# Patient Record
Sex: Male | Born: 1945 | Race: White | Hispanic: No | Smoking: Current every day smoker
Health system: Southern US, Community
[De-identification: ages and names within clinical notes are randomized; demographics above are authoritative.]

## PROBLEM LIST (undated history)

## (undated) DIAGNOSIS — I2781 Cor pulmonale (chronic): Secondary | ICD-10-CM

## (undated) DIAGNOSIS — J439 Emphysema, unspecified: Secondary | ICD-10-CM

## (undated) DIAGNOSIS — I10 Essential (primary) hypertension: Secondary | ICD-10-CM

## (undated) DIAGNOSIS — K219 Gastro-esophageal reflux disease without esophagitis: Secondary | ICD-10-CM

## (undated) DIAGNOSIS — F1021 Alcohol dependence, in remission: Secondary | ICD-10-CM

## (undated) DIAGNOSIS — N400 Enlarged prostate without lower urinary tract symptoms: Secondary | ICD-10-CM

## (undated) DIAGNOSIS — Z9989 Dependence on other enabling machines and devices: Secondary | ICD-10-CM

## (undated) HISTORY — DX: Benign prostatic hyperplasia without lower urinary tract symptoms: N40.0

## (undated) HISTORY — DX: Cor pulmonale (chronic): I27.81

## (undated) HISTORY — DX: Gastro-esophageal reflux disease without esophagitis: K21.9

## (undated) HISTORY — DX: Essential (primary) hypertension: I10

## (undated) HISTORY — DX: Emphysema, unspecified: J43.9

## (undated) HISTORY — DX: Dependence on other enabling machines and devices: Z99.89

## (undated) HISTORY — DX: Alcohol dependence, in remission: F10.21

## (undated) HISTORY — PX: NO PAST SURGERIES: SHX2092

---

## 2013-11-19 ENCOUNTER — Other Ambulatory Visit: Payer: Self-pay | Admitting: Family Medicine

## 2013-11-19 DIAGNOSIS — M25511 Pain in right shoulder: Secondary | ICD-10-CM

## 2013-11-27 ENCOUNTER — Other Ambulatory Visit: Payer: Self-pay

## 2013-12-21 ENCOUNTER — Other Ambulatory Visit: Payer: Self-pay | Admitting: Family Medicine

## 2013-12-21 DIAGNOSIS — R109 Unspecified abdominal pain: Secondary | ICD-10-CM

## 2013-12-27 ENCOUNTER — Ambulatory Visit
Admission: RE | Admit: 2013-12-27 | Discharge: 2013-12-27 | Disposition: A | Discharge status: Home / Self Care | Payer: No Typology Code available for payment source | Source: Ambulatory Visit | Attending: Family Medicine | Admitting: Family Medicine

## 2013-12-27 ENCOUNTER — Other Ambulatory Visit: Payer: Self-pay

## 2013-12-27 DIAGNOSIS — R109 Unspecified abdominal pain: Secondary | ICD-10-CM

## 2014-01-10 ENCOUNTER — Encounter: Payer: Self-pay | Admitting: Gastroenterology

## 2014-01-16 ENCOUNTER — Other Ambulatory Visit: Payer: Self-pay | Admitting: *Deleted

## 2014-01-16 ENCOUNTER — Ambulatory Visit
Admission: RE | Admit: 2014-01-16 | Discharge: 2014-01-16 | Disposition: A | Discharge status: Home / Self Care | Payer: No Typology Code available for payment source | Source: Ambulatory Visit | Attending: *Deleted | Admitting: *Deleted

## 2014-01-16 DIAGNOSIS — R0602 Shortness of breath: Secondary | ICD-10-CM

## 2014-02-12 ENCOUNTER — Ambulatory Visit: Payer: Self-pay | Admitting: Gastroenterology

## 2014-04-19 ENCOUNTER — Ambulatory Visit (INDEPENDENT_AMBULATORY_CARE_PROVIDER_SITE_OTHER): Payer: Medicare (Managed Care) | Admitting: Gastroenterology

## 2014-04-19 ENCOUNTER — Other Ambulatory Visit (INDEPENDENT_AMBULATORY_CARE_PROVIDER_SITE_OTHER): Payer: Medicare (Managed Care)

## 2014-04-19 ENCOUNTER — Encounter: Payer: Self-pay | Admitting: Gastroenterology

## 2014-04-19 VITALS — BP 128/80 | HR 76 | Ht 68.0 in | Wt 119.4 lb

## 2014-04-19 DIAGNOSIS — R1084 Generalized abdominal pain: Secondary | ICD-10-CM

## 2014-04-19 DIAGNOSIS — R634 Abnormal weight loss: Secondary | ICD-10-CM

## 2014-04-19 NOTE — Progress Notes (Signed)
HPI: This is a   very pleasant, chronically ill man whom I am meeting for the first time  Trouble with constipation.  Takes miralax periodically.  Not sure that is helps much at all.  Has not tried fiber supplement.  Cramping a lot.  BMS sometimes help his cramping.  Losing weight, about 20 pounds in past 6-9 months.  Eats well, at least once per day.  No nausea, no vomiting.  He had colonoscopy in [place] (believes it was normal).  Never had EGD.  No colon cancer in his family.  Mother had breast cancer.  Alcoholic, sober for 10 years.  Smokes about a half pack a day. This is much better than his to 3 packs per day previously. He is on chronic oxygen.  Takes alleve 2 times per month.  US recently but no CT scan.  Review of systems: Pertinent positive and negative review of systems were noted in the above HPI section. Complete review of systems was performed and was otherwise normal.    Past Medical History  Diagnosis Date  . Emphysema/COPD   . HTN (hypertension)   . GERD (gastroesophageal reflux disease)   . Cor pulmonale   . CPAP (continuous positive airway pressure) dependence   . History of alcoholism   . BPH (benign prostatic hyperplasia)     Past Surgical History  Procedure Laterality Date  . No past surgeries      Current Outpatient Prescriptions  Medication Sig Dispense Refill  . albuterol (ACCUNEB) 1.25 MG/3ML nebulizer solution Take 1 ampule by nebulization every 6 (six) hours as needed for wheezing.    Marland Kitchen. albuterol (PROVENTIL HFA;VENTOLIN HFA) 108 (90 BASE) MCG/ACT inhaler Inhale 2 puffs into the lungs every 6 (six) hours as needed for wheezing or shortness of breath.    Marland Kitchen. aspirin 81 MG tablet Take 81 mg by mouth daily.    . budesonide-formoterol (SYMBICORT) 160-4.5 MCG/ACT inhaler Inhale 2 puffs into the lungs 2 (two) times daily.    . feeding supplement (BOOST HIGH PROTEIN) LIQD Take 1 Container by mouth 3 (three) times daily between meals.    .  naproxen sodium (ANAPROX) 220 MG tablet Take 220 mg by mouth 2 (two) times daily with a meal.    . OXYGEN Inhale into the lungs.    . pantoprazole (PROTONIX) 40 MG tablet Take 40 mg by mouth daily.    . tamsulosin (FLOMAX) 0.4 MG CAPS capsule Take 0.4 mg by mouth.    . tiotropium (SPIRIVA) 18 MCG inhalation capsule Place 18 mcg into inhaler and inhale daily.     No current facility-administered medications for this visit.    Allergies as of 04/19/2014  . (Not on File)    Family History  Problem Relation Age of Onset  . Breast cancer Mother     History   Social History  . Marital Status: Unknown    Spouse Name: N/A    Number of Children: 3  . Years of Education: GED   Occupational History  . Retired    Social History Main Topics  . Smoking status: Current Every Day Smoker -- 0.50 packs/day for 59 years    Types: Cigarettes  . Smokeless tobacco: Not on file  . Alcohol Use: No  . Drug Use: Not on file  . Sexual Activity: Not on file   Other Topics Concern  . Not on file   Social History Narrative       Physical Exam: BP 128/80 mmHg  Pulse  76  Ht 5\' 8"  (1.727 m)  Wt 119 lb 6 oz (54.148 kg)  BMI 18.16 kg/m2 Constitutional: generally well-appearing Psychiatric: alert and oriented x3 Eyes: extraocular movements intact Mouth: oral pharynx moist, no lesions Neck: supple no lymphadenopathy Cardiovascular: heart regular rate and rhythm Lungs: clear to auscultation bilaterally Abdomen: soft, nontender, nondistended, no obvious ascites, no peritoneal signs, normal bowel sounds Extremities: no lower extremity edema bilaterally Skin: no lesions on visible extremities    Assessment and plan: 68 y.o. male with  abdominal pain, constipation, weight loss  He tells me he had a colonoscopy in [place] last year and we will track down those records. Recommended he start fiber supplements for his chronic constipation. He needs a basic set of labs including CBC and  complete metabolic profile. I'm concerned about his abdominal pain is weight loss, perhaps some underlying latency. He is going to have a CT scan with IV and oral contrast to work that up. He is clearly very frail on chronic oxygen, walks with a walker.

## 2014-04-19 NOTE — Patient Instructions (Addendum)
You will be set up for a CT scan of abdomen and pelvis with IV and oral contrast. You will have labs checked today in the basement lab.  Please head down after you check out with the front desk  (cbc, cmet). We will get records sent from your previous gastroenterologist at [facility] (colonoscopy last year) for review.  This will include any endoscopic (colonoscopy or upper endoscopy) procedures and any associated pathology reports.   Please start taking citrucel (orange flavored) powder fiber supplement.  This may cause some bloating at first but that usually goes away. Begin with a small spoonful and work your way up to a large, heaping spoonful daily over a week.  You have been scheduled for a CT scan of the abdomen and pelvis at [facility] ([address]---this is in the same building as [facility]).         You are scheduled on 12/9 at 11:30am. You should arrive 15 minutes prior to your appointment time for registration. Please follow the written instructions below on the day of your exam:  WARNING: IF YOU ARE ALLERGIC TO IODINE/X-RAY DYE, PLEASE NOTIFY RADIOLOGY IMMEDIATELY AT [phone]! YOU WILL BE GIVEN A 13 HOUR PREMEDICATION PREP.  1) Do not eat or drink anything after 7:30am (4 hours prior to your test) 2) You have been given 2 bottles of oral contrast to drink. The solution may taste               better if refrigerated, but do NOT add ice or any other liquid to this solution. Shake             well before drinking.    Drink 1 bottle of contrast @ 9:30am (2 hours prior to your exam)  Drink 1 bottle of contrast @ 10:30am (1 hour prior to your exam)  You may take any medications as prescribed with a small amount of water except for the following: Metformin, Glucophage, Glucovance, Avandamet, Riomet, Fortamet, Actoplus Met, Janumet, Glumetza or Metaglip. The above medications must be held the day of the exam AND 48 hours after the  exam.  The purpose of you drinking the oral contrast is to aid in the visualization of your intestinal tract. The contrast solution may cause some diarrhea. Before your exam is started, you will be given a small amount of fluid to drink. Depending on your individual set of symptoms, you may also receive an intravenous injection of x-ray contrast/dye. Plan on being at [facility] for 30 minutes or long, depending on the type of exam you are having performed.  This test typically takes 30-45 minutes to complete.  If you have any questions regarding your exam or if you need to reschedule, you may call the CT department at [phone] between the hours of 8:00 am and 5:00 pm, Monday-Friday.  ________________________________________________________________________

## 2014-04-20 LAB — CBC WITH DIFFERENTIAL/PLATELET
BASOS ABS: 0 10*3/uL (ref 0.0–0.1)
Basophils Relative: 0.1 % (ref 0.0–3.0)
Eosinophils Absolute: 0 10*3/uL (ref 0.0–0.7)
Eosinophils Relative: 0.1 % (ref 0.0–5.0)
HEMATOCRIT: 41.2 % (ref 39.0–52.0)
Hemoglobin: 13.5 g/dL (ref 13.0–17.0)
LYMPHS ABS: 0.5 10*3/uL — AB (ref 0.7–4.0)
Lymphocytes Relative: 4.4 % — ABNORMAL LOW (ref 12.0–46.0)
MCHC: 32.7 g/dL (ref 30.0–36.0)
MCV: 87.6 fl (ref 78.0–100.0)
Monocytes Absolute: 0.2 10*3/uL (ref 0.1–1.0)
Monocytes Relative: 1.8 % — ABNORMAL LOW (ref 3.0–12.0)
Neutro Abs: 11.4 10*3/uL — ABNORMAL HIGH (ref 1.4–7.7)
Neutrophils Relative %: 93.6 % — ABNORMAL HIGH (ref 43.0–77.0)
PLATELETS: 268 10*3/uL (ref 150.0–400.0)
RBC: 4.71 Mil/uL (ref 4.22–5.81)
RDW: 16.3 % — ABNORMAL HIGH (ref 11.5–15.5)
WBC: 12.2 10*3/uL — ABNORMAL HIGH (ref 4.0–10.5)

## 2014-04-21 LAB — COMPREHENSIVE METABOLIC PANEL
ALK PHOS: 53 U/L (ref 39–117)
ALT: 14 U/L (ref 0–53)
AST: 21 U/L (ref 0–37)
Albumin: 4.4 g/dL (ref 3.5–5.2)
BILIRUBIN TOTAL: 0.6 mg/dL (ref 0.2–1.2)
BUN: 11 mg/dL (ref 6–23)
CO2: 34 meq/L — AB (ref 19–32)
Calcium: 9.9 mg/dL (ref 8.4–10.5)
Chloride: 97 mEq/L (ref 96–112)
Creatinine, Ser: 0.7 mg/dL (ref 0.4–1.5)
GFR: 123.27 mL/min (ref >60.00)
Glucose, Bld: 100 mg/dL — ABNORMAL HIGH (ref 70–99)
Potassium: 3.9 mEq/L (ref 3.5–5.1)
Sodium: 141 mEq/L (ref 135–145)
TOTAL PROTEIN: 7 g/dL (ref 6.0–8.3)

## 2014-04-23 ENCOUNTER — Other Ambulatory Visit: Payer: Self-pay | Admitting: *Deleted

## 2014-04-23 ENCOUNTER — Ambulatory Visit
Admission: RE | Admit: 2014-04-23 | Discharge: 2014-04-23 | Disposition: A | Discharge status: Home / Self Care | Payer: No Typology Code available for payment source | Source: Ambulatory Visit | Attending: *Deleted | Admitting: *Deleted

## 2014-04-23 ENCOUNTER — Other Ambulatory Visit: Payer: Self-pay

## 2014-04-23 DIAGNOSIS — J449 Chronic obstructive pulmonary disease, unspecified: Secondary | ICD-10-CM

## 2014-04-23 DIAGNOSIS — R079 Chest pain, unspecified: Secondary | ICD-10-CM

## 2014-04-23 DIAGNOSIS — R935 Abnormal findings on diagnostic imaging of other abdominal regions, including retroperitoneum: Secondary | ICD-10-CM

## 2014-04-23 DIAGNOSIS — D72829 Elevated white blood cell count, unspecified: Secondary | ICD-10-CM

## 2014-04-24 ENCOUNTER — Ambulatory Visit (INDEPENDENT_AMBULATORY_CARE_PROVIDER_SITE_OTHER)
Admission: RE | Admit: 2014-04-24 | Discharge: 2014-04-24 | Disposition: A | Discharge status: Home / Self Care | Payer: Self-pay | Source: Ambulatory Visit | Attending: Gastroenterology | Admitting: Gastroenterology

## 2014-04-24 DIAGNOSIS — R1084 Generalized abdominal pain: Secondary | ICD-10-CM

## 2014-04-24 MED ORDER — IOHEXOL 300 MG/ML  SOLN: 100.0000 mL | Freq: Once | INTRAMUSCULAR | Status: AC | PRN

## 2014-06-26 ENCOUNTER — Ambulatory Visit: Payer: Self-pay | Admitting: Gastroenterology

## 2014-09-03 ENCOUNTER — Ambulatory Visit: Payer: Self-pay | Admitting: Gastroenterology

## 2014-09-03 ENCOUNTER — Ambulatory Visit (INDEPENDENT_AMBULATORY_CARE_PROVIDER_SITE_OTHER): Payer: Medicare (Managed Care) | Admitting: Gastroenterology

## 2014-09-03 ENCOUNTER — Encounter: Payer: Self-pay | Admitting: Gastroenterology

## 2014-09-03 VITALS — BP 128/64 | HR 84 | Ht 68.0 in | Wt 118.2 lb

## 2014-09-03 DIAGNOSIS — K5909 Other constipation: Secondary | ICD-10-CM | POA: Diagnosis not present

## 2014-09-03 NOTE — Patient Instructions (Signed)
Increase to 2 doses of miralax, once daily. Please start taking citrucel (orange flavored) powder fiber supplement.  This may cause some bloating at first but that usually goes away. Begin with a small spoonful and work your way up to a large, heaping spoonful daily over a week. Call Dr. Christella HartiganJacobs' office in 6 weeks, if no significant improvement in your constipation, will consider linzess trial.

## 2014-09-03 NOTE — Progress Notes (Signed)
Review of pertinent gastrointestinal problems: 1. abd pain, constipation, weight loss: Evaluated by Dr. Christella Hartigan December 2015, very frail on oxygen and walking with a walke, or gets around in an automatic wheelchair.  Labs showed slightly elevated white count with a left shift. I suggested urinalysis however he never had that done. CXR showed emphysema.  CT scan abd/pelvis with IV and oral contrast suggested dilated left urinary collecting system and I recommended referral to urologist.  HPI: This is a  very pleasant 69 year old man whom I last saw 4 months ago  Chief complaint is continued constipation  He had a rough weekend with cramping abd pains, couldn't catch his breath.  A lot of cramping.  He will have a bowel movements every 3-5 days, sometimes daily. Always has to strain quite a bit to move his bowels. Never sees blood.  Has been taking MiraLAX one dose once daily for the past 3 or 4 months and really has not noticed any improvement in his bowels   Past Medical History  Diagnosis Date  . Emphysema/COPD   . HTN (hypertension)   . GERD (gastroesophageal reflux disease)   . Cor pulmonale   . CPAP (continuous positive airway pressure) dependence   . History of alcoholism   . BPH (benign prostatic hyperplasia)     Past Surgical History  Procedure Laterality Date  . No past surgeries      Current Outpatient Prescriptions  Medication Sig Dispense Refill  . albuterol (ACCUNEB) 1.25 MG/3ML nebulizer solution Take 1 ampule by nebulization every 6 (six) hours as needed for wheezing.    Marland Kitchen albuterol (PROVENTIL HFA;VENTOLIN HFA) 108 (90 BASE) MCG/ACT inhaler Inhale 2 puffs into the lungs every 6 (six) hours as needed for wheezing or shortness of breath.    Marland Kitchen aspirin 81 MG tablet Take 81 mg by mouth daily.    . feeding supplement (BOOST HIGH PROTEIN) LIQD Take 1 Container by mouth 3 (three) times daily between meals.    . OXYGEN Inhale 2 Units into the lungs.     . pantoprazole  (PROTONIX) 40 MG tablet Take 40 mg by mouth daily.     No current facility-administered medications for this visit.    Allergies as of 09/03/2014  . (No Known Allergies)    Family History  Problem Relation Age of Onset  . Breast cancer Mother     History   Social History  . Marital Status: Unknown    Spouse Name: N/A  . Number of Children: 3  . Years of Education: GED   Occupational History  . Retired    Social History Main Topics  . Smoking status: Current Every Day Smoker -- 0.50 packs/day for 59 years    Types: Cigarettes  . Smokeless tobacco: Not on file  . Alcohol Use: No  . Drug Use: Not on file  . Sexual Activity: Not on file   Other Topics Concern  . Not on file   Social History Narrative     Physical Exam: BP 128/64 mmHg  Pulse 84  Ht  (1.727 m)  Wt 118 lb 4 oz (53.638 kg)  BMI 17.98 kg/m2 Constitutional: Chronically ill-appearing on oxygen, sitting in a automatic electric wheelchair Psychiatric: alert and oriented x3 Abdomen: soft, nontender, nondistended, no obvious ascites, no peritoneal signs, normal bowel sounds   Assessment and plan: 69 y.o. male with end-stage COPD, emphysema, chronic constipation  I have quite a lot of confidence that I'll be able to get his bowels moving  easier by titrating some of his medicines. He is going to increase his MiraLAX dose to 2 doses once daily. He will also add daily powder fiber supplement. He will call to report on his response in 6 weeks and if he is still significantly troubled with constipation then I will likely add 1 low strength Linzess pill once daily. He has severe COPD, basically unable to walk on oxygen therapy and he is not a candidate for any endoscopic therapy unless an emergency arises   Rob Buntinganiel Baron Parmelee, MD [facility] 09/03/2014, 10:04 AM

## 2015-09-22 IMAGING — CR DG CHEST 2V
2 series · 2 of 2 positions shown · non-contrast
Comparison: CT chest 06/07/2013 and chest radiograph 02/21/2013.

CLINICAL DATA: Shortness of breath, on oxygen.

EXAM:
CHEST  2 VIEW

[w chest lat]
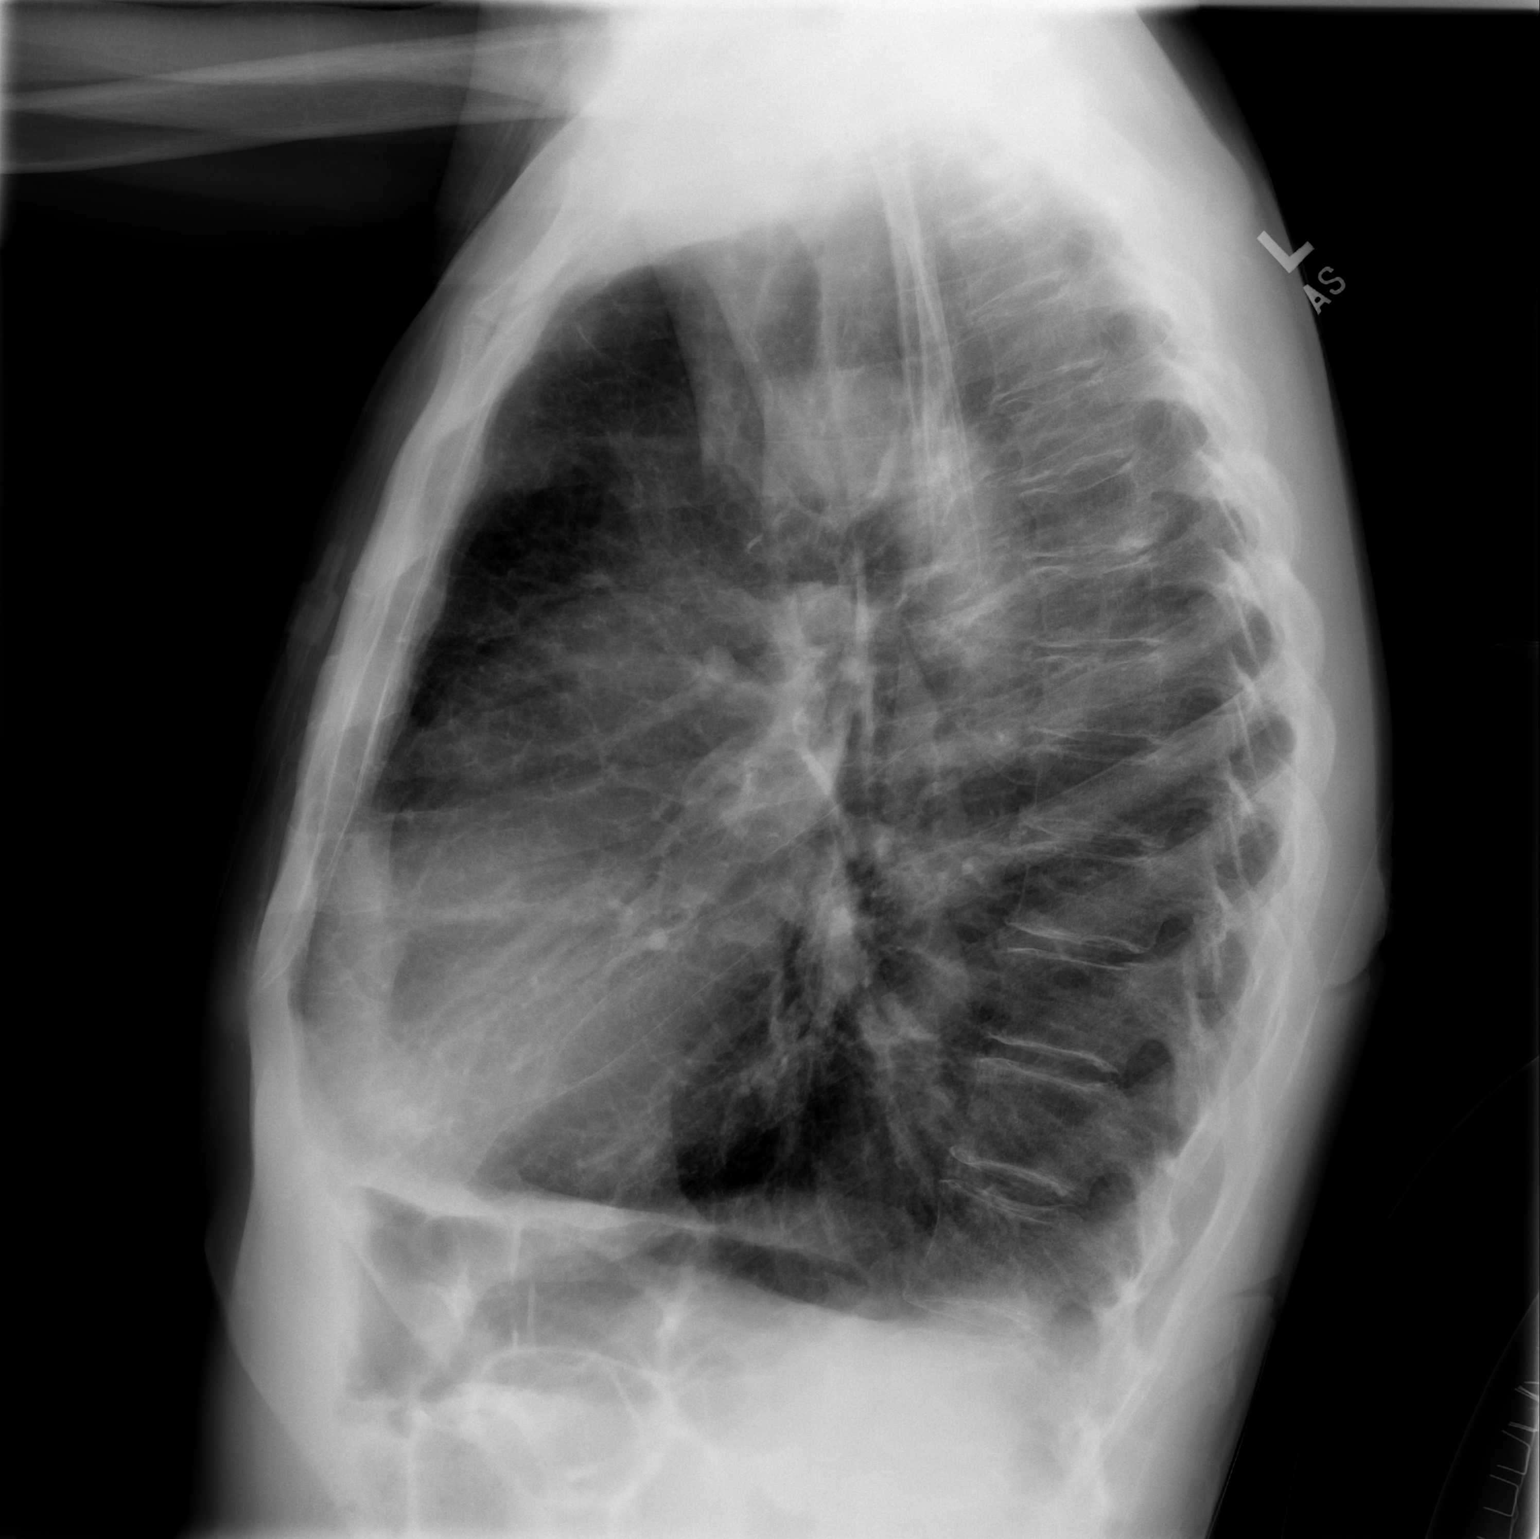

[w chest ap]
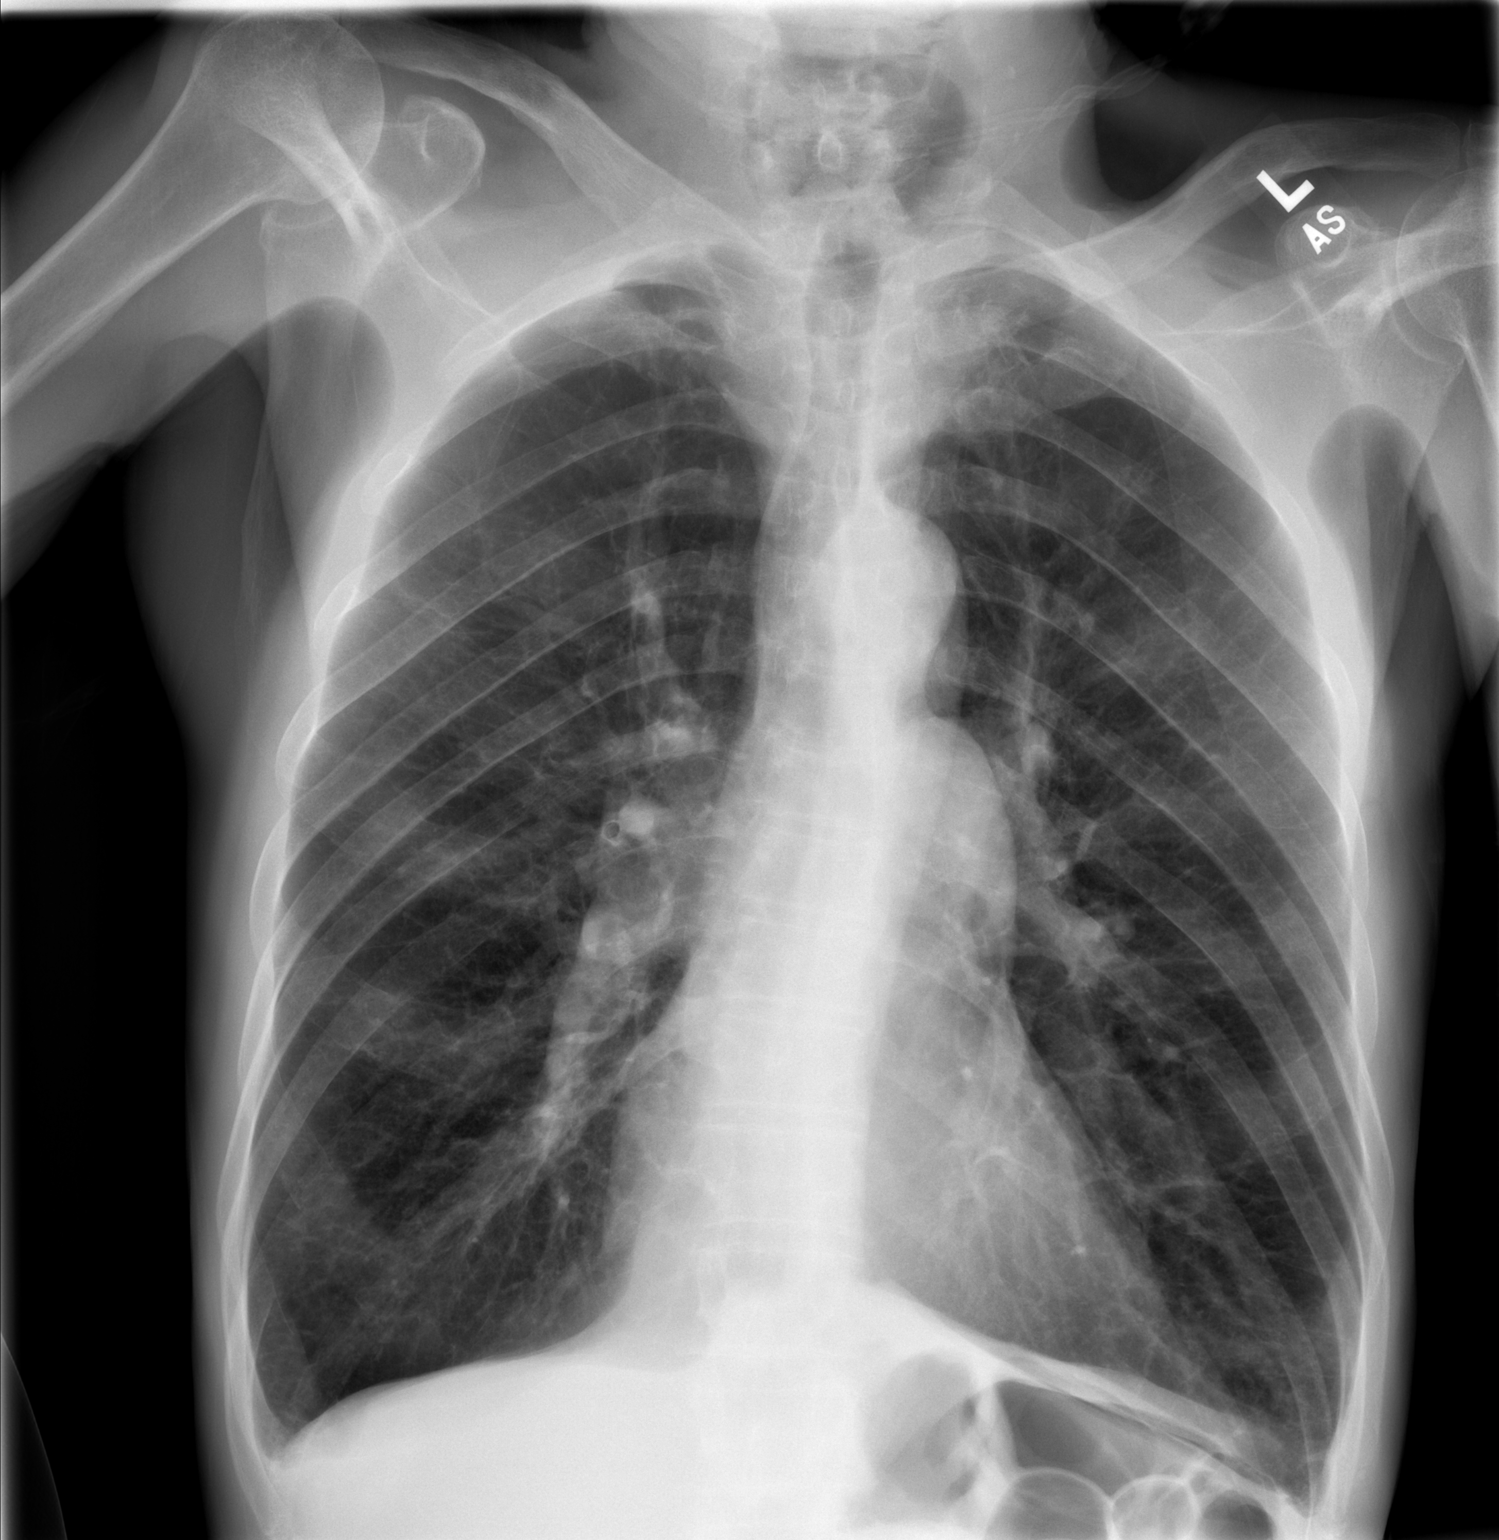

[2 of 2 positions shown; findings below may reference images not displayed]

FINDINGS: Trachea is midline. Heart size normal. Lungs are emphysematous but
clear. No pleural fluid.
IMPRESSION: Emphysema without acute finding.

## 2015-12-29 IMAGING — CT CT ABD-PELV W/ CM
2 of 5 series · 16 of 46 positions shown, 18 images · IV contrast (Omnipaque 300)
Comparison: 12/27/2013; 06/07/2013

CLINICAL DATA: Mid abdominal pain.  Constipation

EXAM:
CT ABDOMEN AND PELVIS WITH CONTRAST
TECHNIQUE: Multidetector CT imaging of the abdomen and pelvis was performed
using the standard protocol following bolus administration of
intravenous contrast.
CONTRAST:  100 cc Omnipaque 300

[Series 2: abd/ pel 5mm · axial · 0.70mm/px · z∈[-556,-200]mm · 13 of 81 slices shown, 15 images]
[im 5/81  soft-tissue]
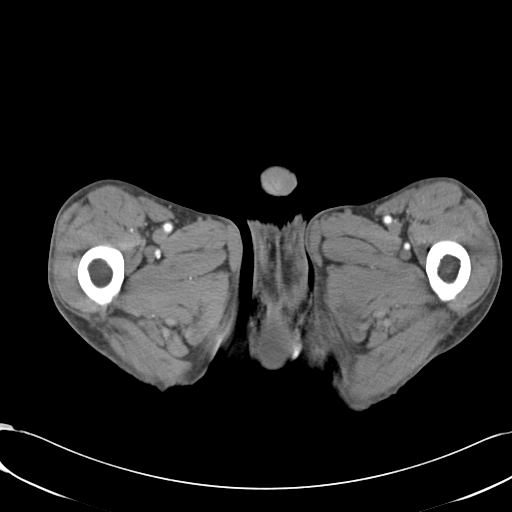
[im 5/81  bone]
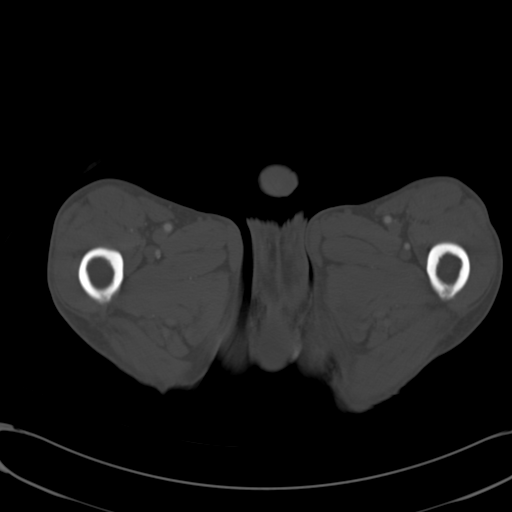
[im 10/81  soft-tissue]
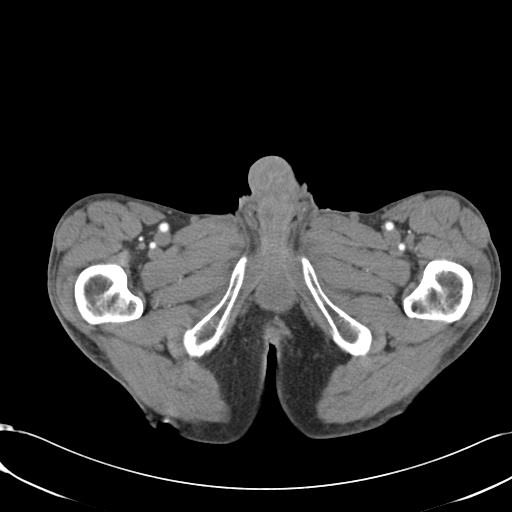
[im 19/81  soft-tissue]
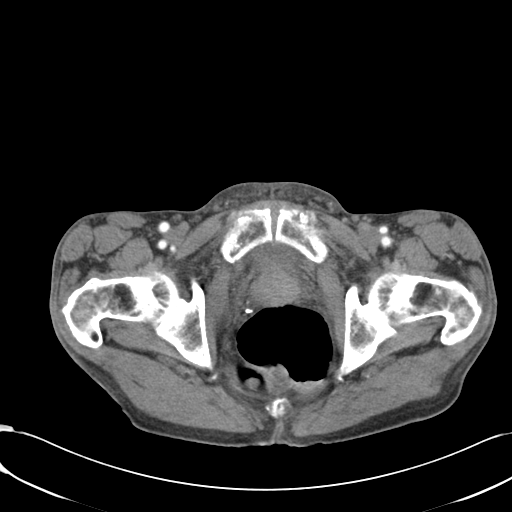
[im 24/81  soft-tissue]
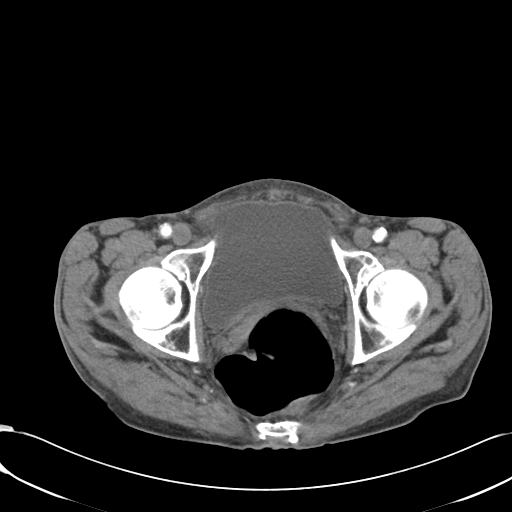
[im 29/81  soft-tissue]
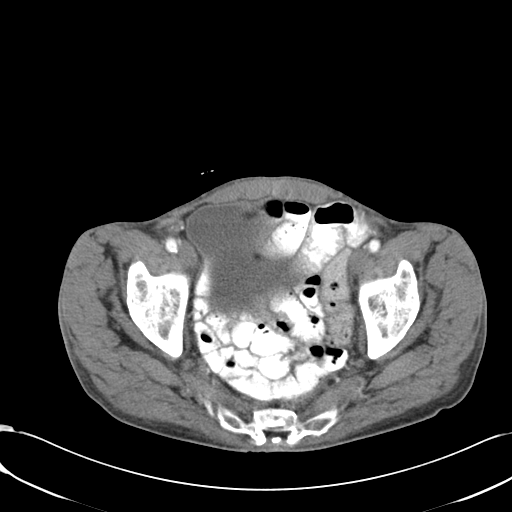
[im 33/81  soft-tissue]
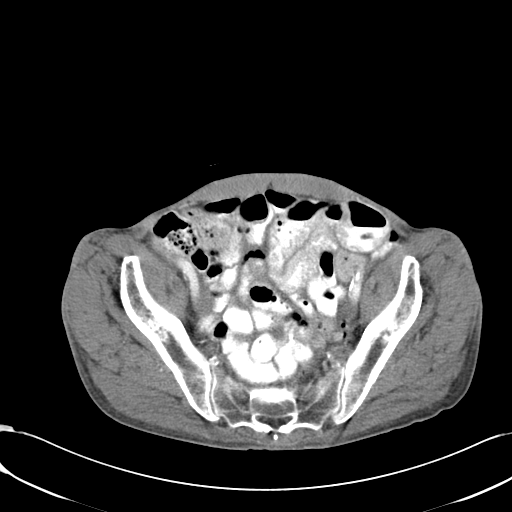
[im 43/81  soft-tissue]
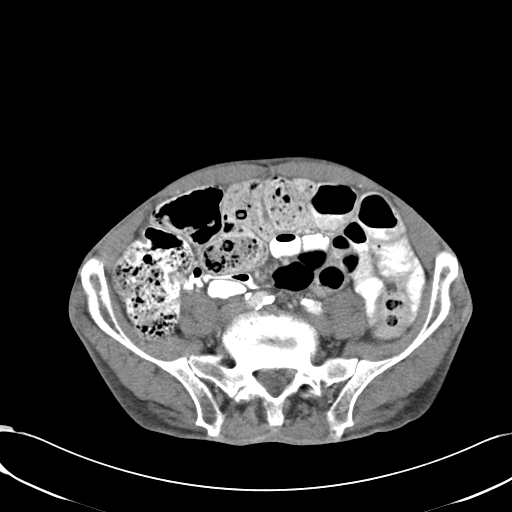
[im 48/81  soft-tissue]
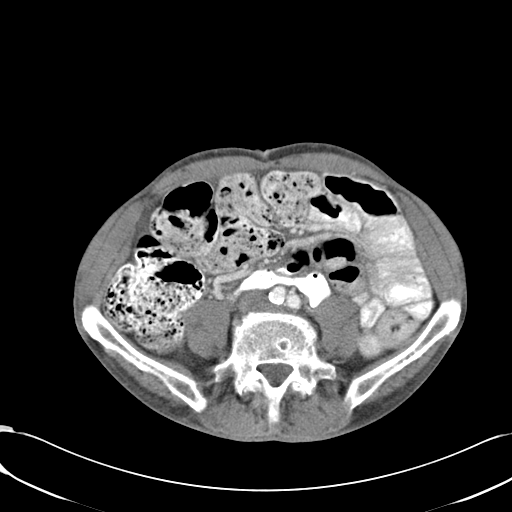
[im 52/81  soft-tissue]
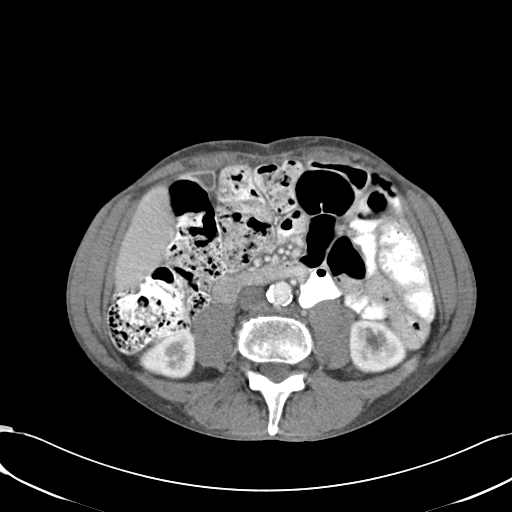
[im 52/81  bone]
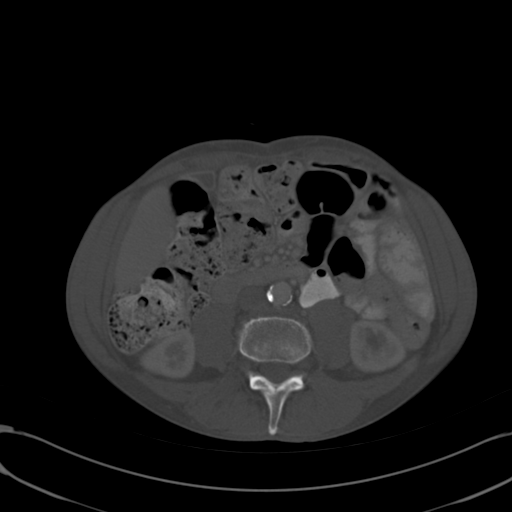
[im 57/81  soft-tissue]
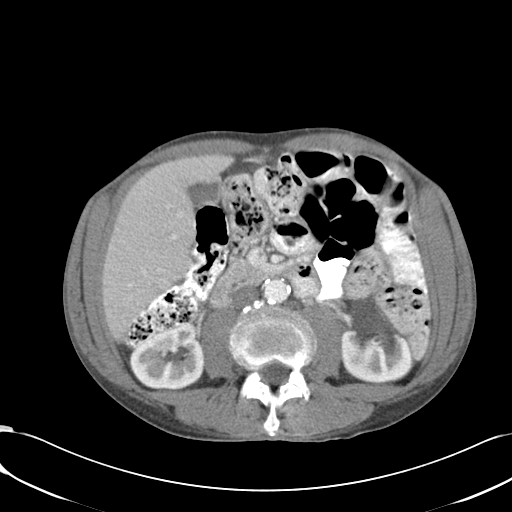
[im 62/81  soft-tissue]
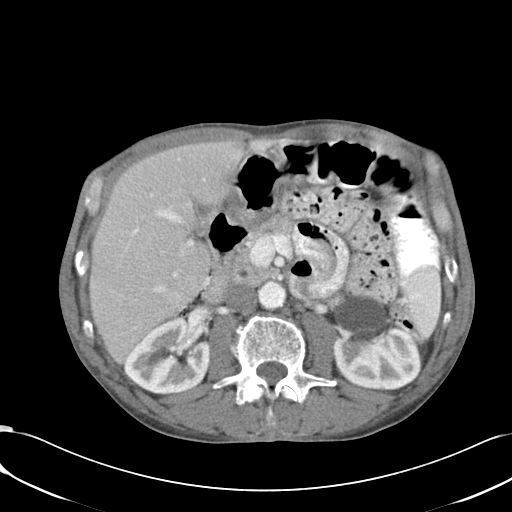
[im 71/81  soft-tissue]
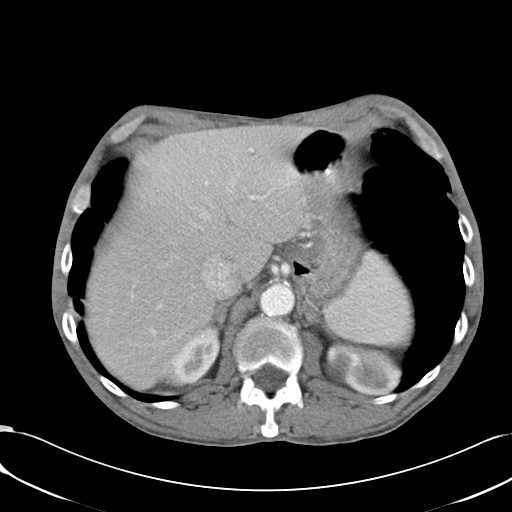
[im 76/81  soft-tissue]
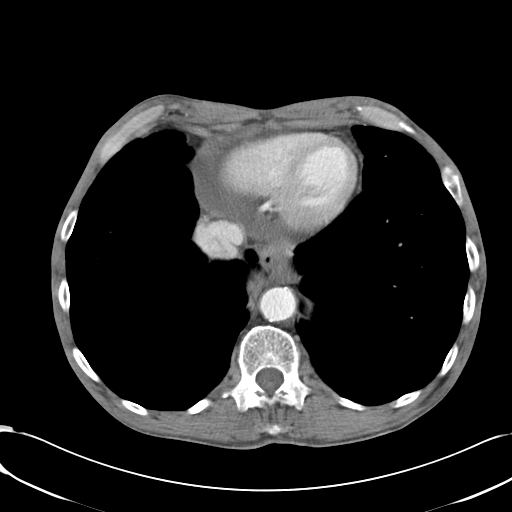

[Series 602: <mpr range> · coronal · 0.81mm/px · 3 of 125 slices shown]
[im 42/125  soft-tissue]
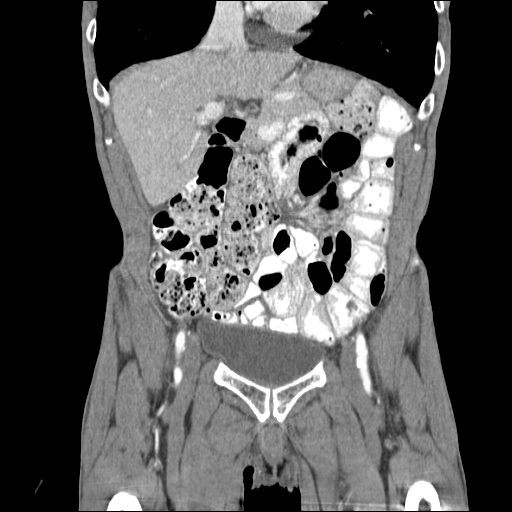
[im 56/125  soft-tissue]
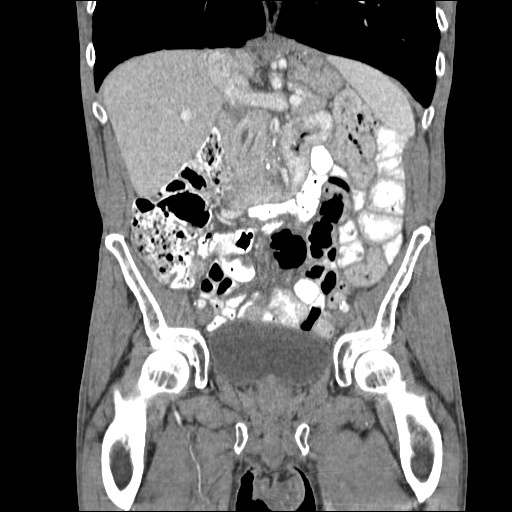
[im 69/125  soft-tissue]
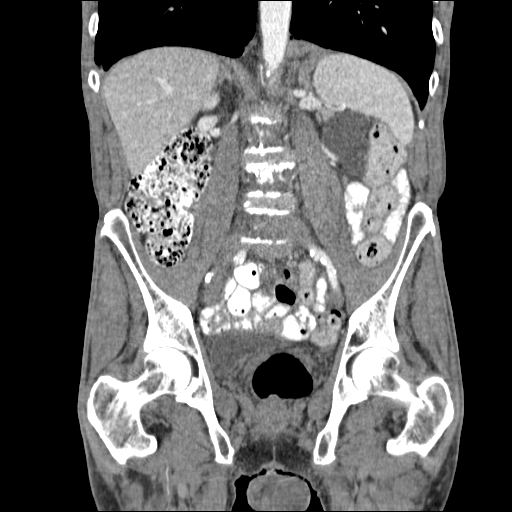

[16 of 46 positions shown; findings below may reference images not displayed]

FINDINGS: Lower chest: Emphysema. Healing left posterior tenth rib fracture
with non mature marginal callus.

Hepatobiliary: 8 mm hypodense lesion in segment 3 of the liver, near
the falciform ligament on image 15 of series 2, likely a cyst. Liver
otherwise unremarkable. No specific CT abnormality of the
gallbladder. No purse to ball biliary dilatation.

Pancreas: Unremarkable

Spleen: Unremarkable

Adrenals/Urinary Tract: Left kidney upper pole cyst. Left
hydronephrosis without hydroureter or visualize stone. Dilated left
collecting system with dilatation terminating at the left UPJ. There
is excretion of contrast into the left collecting system and no
significant asymmetry of enhancement. Crossing vessels noted in the
vicinity of the left UPJ.

Stomach/Bowel: Mild prominence of gas and stool in the colon.
Sigmoid colon diverticulosis.

Vascular/Lymphatic: Aortoiliac atherosclerotic vascular disease. The
common hepatic artery has an adjacent origin from the abdominal
aorta and adjacent to the splenic artery.

Reproductive: Marginal calcification along the central zone of the
prostate gland, without enlargement of the prostate gland.

Other: No supplemental non-categorized findings.

Musculoskeletal: Mild right foraminal stenosis at L5-S1 due to disc
bulge and intervertebral spurring.
IMPRESSION: 1.  Prominent stool throughout the colon favors constipation.
2. Emphysema.
3. Healing left posterior tenth rib fracture.
4. Dilated left collecting system due to suspected partial left UPJ
obstruction. Crossing vessels in the vicinity of the transition
might constitute a cause.
5. Sigmoid colon diverticulosis.
6.  Aortoiliac atherosclerotic vascular disease.
7. Mild right foraminal stenosis at L5-S1 due to spondylosis and
degenerative disc disease.

## 2016-01-16 DEATH — deceased
